# Patient Record
Sex: Male | Born: 1975 | Hispanic: Refuse to answer | Marital: Married
Health system: Southern US, Community
[De-identification: ages and names within clinical notes are randomized; demographics above are authoritative.]

---

## 2009-11-02 ENCOUNTER — Encounter (INDEPENDENT_AMBULATORY_CARE_PROVIDER_SITE_OTHER): Payer: Self-pay | Admitting: *Deleted

## 2009-11-09 ENCOUNTER — Ambulatory Visit: Payer: Self-pay | Admitting: Sports Medicine

## 2009-11-09 DIAGNOSIS — M25569 Pain in unspecified knee: Secondary | ICD-10-CM | POA: Insufficient documentation

## 2009-12-09 ENCOUNTER — Ambulatory Visit: Payer: Self-pay | Admitting: Sports Medicine

## 2009-12-13 ENCOUNTER — Encounter: Payer: Self-pay | Admitting: Sports Medicine

## 2013-11-25 ENCOUNTER — Ambulatory Visit
Admission: RE | Admit: 2013-11-25 | Discharge: 2013-11-25 | Disposition: A | Discharge status: Home / Self Care | Payer: Worker's Compensation | Source: Ambulatory Visit | Attending: Occupational Medicine | Admitting: Occupational Medicine

## 2013-11-25 ENCOUNTER — Other Ambulatory Visit: Payer: Self-pay | Admitting: Occupational Medicine

## 2013-11-25 DIAGNOSIS — T1490XA Injury, unspecified, initial encounter: Secondary | ICD-10-CM

## 2020-10-02 ENCOUNTER — Emergency Department (HOSPITAL_COMMUNITY)
Admission: EM | Admit: 2020-10-02 | Discharge: 2020-10-02 | Disposition: A | Discharge status: Home / Self Care | Payer: 59 | Attending: Emergency Medicine | Admitting: Emergency Medicine

## 2020-10-02 ENCOUNTER — Other Ambulatory Visit: Payer: Self-pay

## 2020-10-02 ENCOUNTER — Emergency Department (HOSPITAL_COMMUNITY): Payer: 59

## 2020-10-02 DIAGNOSIS — R110 Nausea: Secondary | ICD-10-CM | POA: Diagnosis not present

## 2020-10-02 DIAGNOSIS — R42 Dizziness and giddiness: Secondary | ICD-10-CM | POA: Insufficient documentation

## 2020-10-02 DIAGNOSIS — R079 Chest pain, unspecified: Secondary | ICD-10-CM | POA: Diagnosis present

## 2020-10-02 DIAGNOSIS — R0789 Other chest pain: Secondary | ICD-10-CM | POA: Insufficient documentation

## 2020-10-02 LAB — BASIC METABOLIC PANEL
Anion gap: 10 (ref 5–15)
BUN: 14 mg/dL (ref 6–20)
CO2: 24 mmol/L (ref 22–32)
Calcium: 9 mg/dL (ref 8.9–10.3)
Chloride: 105 mmol/L (ref 98–111)
Creatinine, Ser: 0.98 mg/dL (ref 0.61–1.24)
GFR, Estimated: >60 mL/min (ref >60)
Glucose, Bld: 100 mg/dL — ABNORMAL HIGH (ref 70–99)
Potassium: 4 mmol/L (ref 3.5–5.1)
Sodium: 139 mmol/L (ref 135–145)

## 2020-10-02 LAB — CBC
HCT: 40.5 % (ref 39.0–52.0)
Hemoglobin: 13.3 g/dL (ref 13.0–17.0)
MCH: 28.8 pg (ref 26.0–34.0)
MCHC: 32.8 g/dL (ref 30.0–36.0)
MCV: 87.7 fL (ref 80.0–100.0)
Platelets: 261 10*3/uL (ref 150–400)
RBC: 4.62 MIL/uL (ref 4.22–5.81)
RDW: 12.3 % (ref 11.5–15.5)
WBC: 5.4 10*3/uL (ref 4.0–10.5)
nRBC: 0 % (ref 0.0–0.2)

## 2020-10-02 LAB — TROPONIN I (HIGH SENSITIVITY)
Troponin I (High Sensitivity): <2 ng/L (ref <18)
Troponin I (High Sensitivity): <2 ng/L (ref <18)

## 2020-10-02 MED ORDER — METHOCARBAMOL 500 MG PO TABS
500.0000 mg | ORAL_TABLET | Freq: Once | ORAL | Status: DC
  Filled 2020-10-02: qty 1

## 2020-10-02 MED ORDER — ALUM & MAG HYDROXIDE-SIMETH 200-200-20 MG/5ML PO SUSP
30.0000 mL | Freq: Once | ORAL | Status: AC
  Administered 2020-10-02: 30 mL via ORAL
  Filled 2020-10-02: qty 30

## 2020-10-02 NOTE — ED Provider Notes (Signed)
[facility] EMERGENCY DEPARTMENT Provider Note   CSN: [id number] Arrival date & time: 10/02/20  1152     History Chief Complaint  Patient presents with  . Chest Pain    Scott Douglas is a 44 y.o. male who presents to ED with a chief complaint of chest pain.  Approximately 10 hours ago developed left-sided chest pain radiating to back and left arm.  Describes the pain as sharp, intermittent without specific aggravating or alleviating factor.  Reports similar chest pain in the past but states that the back pain and arm pain is unlike anything he has felt before.  He denies any injury or trauma.  He takes 800 mg of ibuprofen every morning due to arthritis, he took it this morning without significant improvement.  Does endorse nausea, lightheadedness this morning while ambulating in the grocery store.  He denies history of MI, DVT, PE, recent immobilization, leg swelling, hemoptysis, family history of sudden cardiac death at a young age.  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  He denies any abdominal pain, vomiting, fever, cough, shortness of breath.  HPI     No past medical history on file.  Patient Active Problem List   Diagnosis Date Noted  . KNEE PAIN 11/09/2009       No family history on file.  Social History   Tobacco Use  . Smoking status: Not on file  Substance Use Topics  . Alcohol use: Not on file  . Drug use: Not on file    Home Medications Prior to Admission medications   Not on File    Allergies    Patient has no known allergies.  Review of Systems   Review of Systems  Constitutional: Negative for appetite change, chills and fever.  HENT: Negative for ear pain, rhinorrhea, sneezing and sore throat.   Eyes: Negative for photophobia and visual disturbance.  Respiratory: Negative for cough, chest tightness, shortness of breath and wheezing.    Cardiovascular: Positive for chest pain. Negative for palpitations.  Gastrointestinal: Negative for abdominal pain, blood in stool, constipation, diarrhea, nausea and vomiting.  Genitourinary: Negative for dysuria, hematuria and urgency.  Musculoskeletal: Positive for back pain. Negative for myalgias.  Skin: Negative for rash.  Neurological: Negative for dizziness, weakness and light-headedness.    Physical Exam Updated Vital Signs BP (!) 129/93   Pulse 74   Temp 98.1 F (36.7 C) (Oral)   Resp 16   Ht 5\' 10"  (1.778 m)   Wt 90.7 kg   SpO2 100%   BMI 28.70 kg/m   Physical Exam Vitals and nursing note reviewed.  Constitutional:      General: He is not in acute distress.    Appearance: He is well-developed.     Comments: Speaking in complete sentences without difficulty. No signs of respiratory distress.  HENT:     Head: Normocephalic and atraumatic.     Nose: Nose normal.  Eyes:     General: No scleral icterus.       Left eye: No discharge.     Conjunctiva/sclera: Conjunctivae normal.  Cardiovascular:     Rate and Rhythm: Normal rate and regular rhythm.     Heart sounds: Normal heart sounds. No murmur heard.  No friction rub. No gallop.   Pulmonary:     Effort: Pulmonary effort is normal. No respiratory distress.     Breath sounds: Normal breath sounds.  Abdominal:     General: Bowel sounds are normal. There is no distension.     Palpations: Abdomen is soft.     Tenderness: There is no abdominal tenderness. There is no guarding.  Musculoskeletal:        General: Normal range of motion.     Cervical back: Normal range of motion and neck supple.     Comments: No lower extremity edema, erythema or calf tenderness bilaterally.  Skin:    General: Skin is warm and dry.     Findings: No rash.  Neurological:     Mental Status: He is alert and oriented to person, place, and time.     Cranial Nerves: No cranial nerve deficit.     Motor: No abnormal muscle tone.      Coordination: Coordination normal.     ED Results / Procedures / Treatments   Labs (all labs ordered are listed, but only abnormal results are displayed) Labs Reviewed  BASIC METABOLIC PANEL - Abnormal; Notable for the following components:      Result Value   Glucose, Bld 100 (*)    All other components within normal limits  CBC  TROPONIN I (HIGH SENSITIVITY)  TROPONIN I (HIGH SENSITIVITY)    EKG EKG Interpretation  Date/Time:  Saturday October 02 2020 11:51:52 EDT Ventricular Rate:  73 PR Interval:  162 QRS Duration: 104 QT Interval:  382 QTC Calculation: 420 R Axis:   57 Text Interpretation: Normal sinus rhythm Incomplete right bundle branch block Borderline ECG No old tracing to compare Confirmed by Rolan Bucco [id number]) on 10/02/2020 2:10:19 PM   Radiology DG Chest 2 View  Result Date: 10/02/2020 CLINICAL DATA:  Chest pain EXAM: CHEST - 2 VIEW COMPARISON:  None. FINDINGS: The heart size and mediastinal contours are within normal limits. Both lungs are clear. The visualized skeletal structures are unremarkable. IMPRESSION: No active cardiopulmonary disease. Electronically Signed   By: Sherian Rein M.D.   On: 10/02/2020 13:11    Procedures Procedures (including critical care time)  Medications Ordered in ED Medications  methocarbamol (ROBAXIN) tablet 500 mg (500 mg Oral Not Given 10/02/20 1410)  alum & mag hydroxide-simeth (MAALOX/MYLANTA) 200-200-20 MG/5ML suspension 30 mL (30 mLs Oral Given 10/02/20 1405)    ED Course  I have reviewed the triage vital signs and the nursing notes.  Pertinent labs & imaging results that were available during my care of the patient were reviewed by me and considered in my medical decision making (see chart for details).    MDM Rules/Calculators/A&P                          45 year old male presenting to the ED with a chief complaint of chest pain.  Intermittent left-sided chest pain for the past 10 hours radiating to back and  left arm.  Intermittent without specific aggravating or alleviating factors.  Similar chest pain in the past although it is not radiated like this.  Reports nausea and lightheadedness but denies any abdominal pain, vomiting, fever, cough or leg swelling.  No history of DVT, PE, MI, recent immobilization, hemoptysis.  He took a dose of ibuprofen this morning as he does every morning to help with arthritis.  No significant improvement in pain.  On exam patient is overall well-appearing.  No lower extremity edema, erythema or calf tenderness that concern me for DVT.  He is hemodynamically stable, he is not tachycardic, tachypneic or hypoxic.  Abdomen is soft,  nontender nondistended.  EKG shows sinus rhythm, IRBBB, no STEMI, no prior tracings for comparison.  Chest x-ray without any acute abnormalities.  CBC, BMP unremarkable.  Initial and delta troponin are both unremarkable.  Patient is low risk by heart score (2) and with his reassuring work-up today doubt his symptoms are due to ACS.  He is PERC negative.  No structural cause such as pneumothorax or pneumonia. Low suspicion for dissection. Suspect musculoskeletal cause versus gastritis versus other nonemergent.  Feel that he will benefit from PCP follow-up.  He remains in no acute distress here. Patient is agreeable to the plan. Return precautions given.  All imaging, if done today, including plain films, CT scans, and ultrasounds, independently reviewed by me, and interpretations confirmed via formal radiology reads.  Patient is hemodynamically stable, in NAD, and able to ambulate in the ED. Evaluation does not show pathology that would require ongoing emergent intervention or inpatient treatment. I explained the diagnosis to the patient. Pain has been managed and has no complaints prior to discharge. Patient is comfortable with above plan and is stable for discharge at this time. All questions were answered prior to disposition. Strict return precautions for  returning to the ED were discussed. Encouraged follow up with PCP.   An After Visit Summary was printed and given to the patient.   Portions of this note were generated with Scientist, clinical (histocompatibility and immunogenetics). Dictation errors may occur despite best attempts at proofreading.  Final Clinical Impression(s) / ED Diagnoses Final diagnoses:  Chest wall pain    Rx / DC Orders ED Discharge Orders    None       Dietrich Pates, PA-C 10/02/20 1536    Rolan Bucco, MD 10/04/20 [phone]

## 2020-10-02 NOTE — ED Triage Notes (Signed)
Pt here from work with [employer] for eval of intermittent L sided chest pain with radiation to back and L arm since 0300 this morning. Endorses nausea and lightheadedness while walking in the grocery store this morning.

## 2020-10-02 NOTE — Discharge Instructions (Signed)
Your work-up today including your EKG, troponins (heart enzymes), chest x-ray, blood counts and electrolyte panel were all reassuring, no significant abnormalities were noted. It is important for you to follow-up with your primary care provider and/or cardiology as they deem necessary. Take over-the-counter medications to help with reflux or muscle pain as needed. Return to the ER if you start to experience worsening chest pain, shortness of breath, leg swelling, trouble walking or wheezing.

## 2022-02-18 IMAGING — DX DG CHEST 2V
2 series · 2 of 2 positions shown · non-contrast
Comparison: None.

CLINICAL DATA: Chest pain

EXAM:
CHEST - 2 VIEW

[w chest pa]
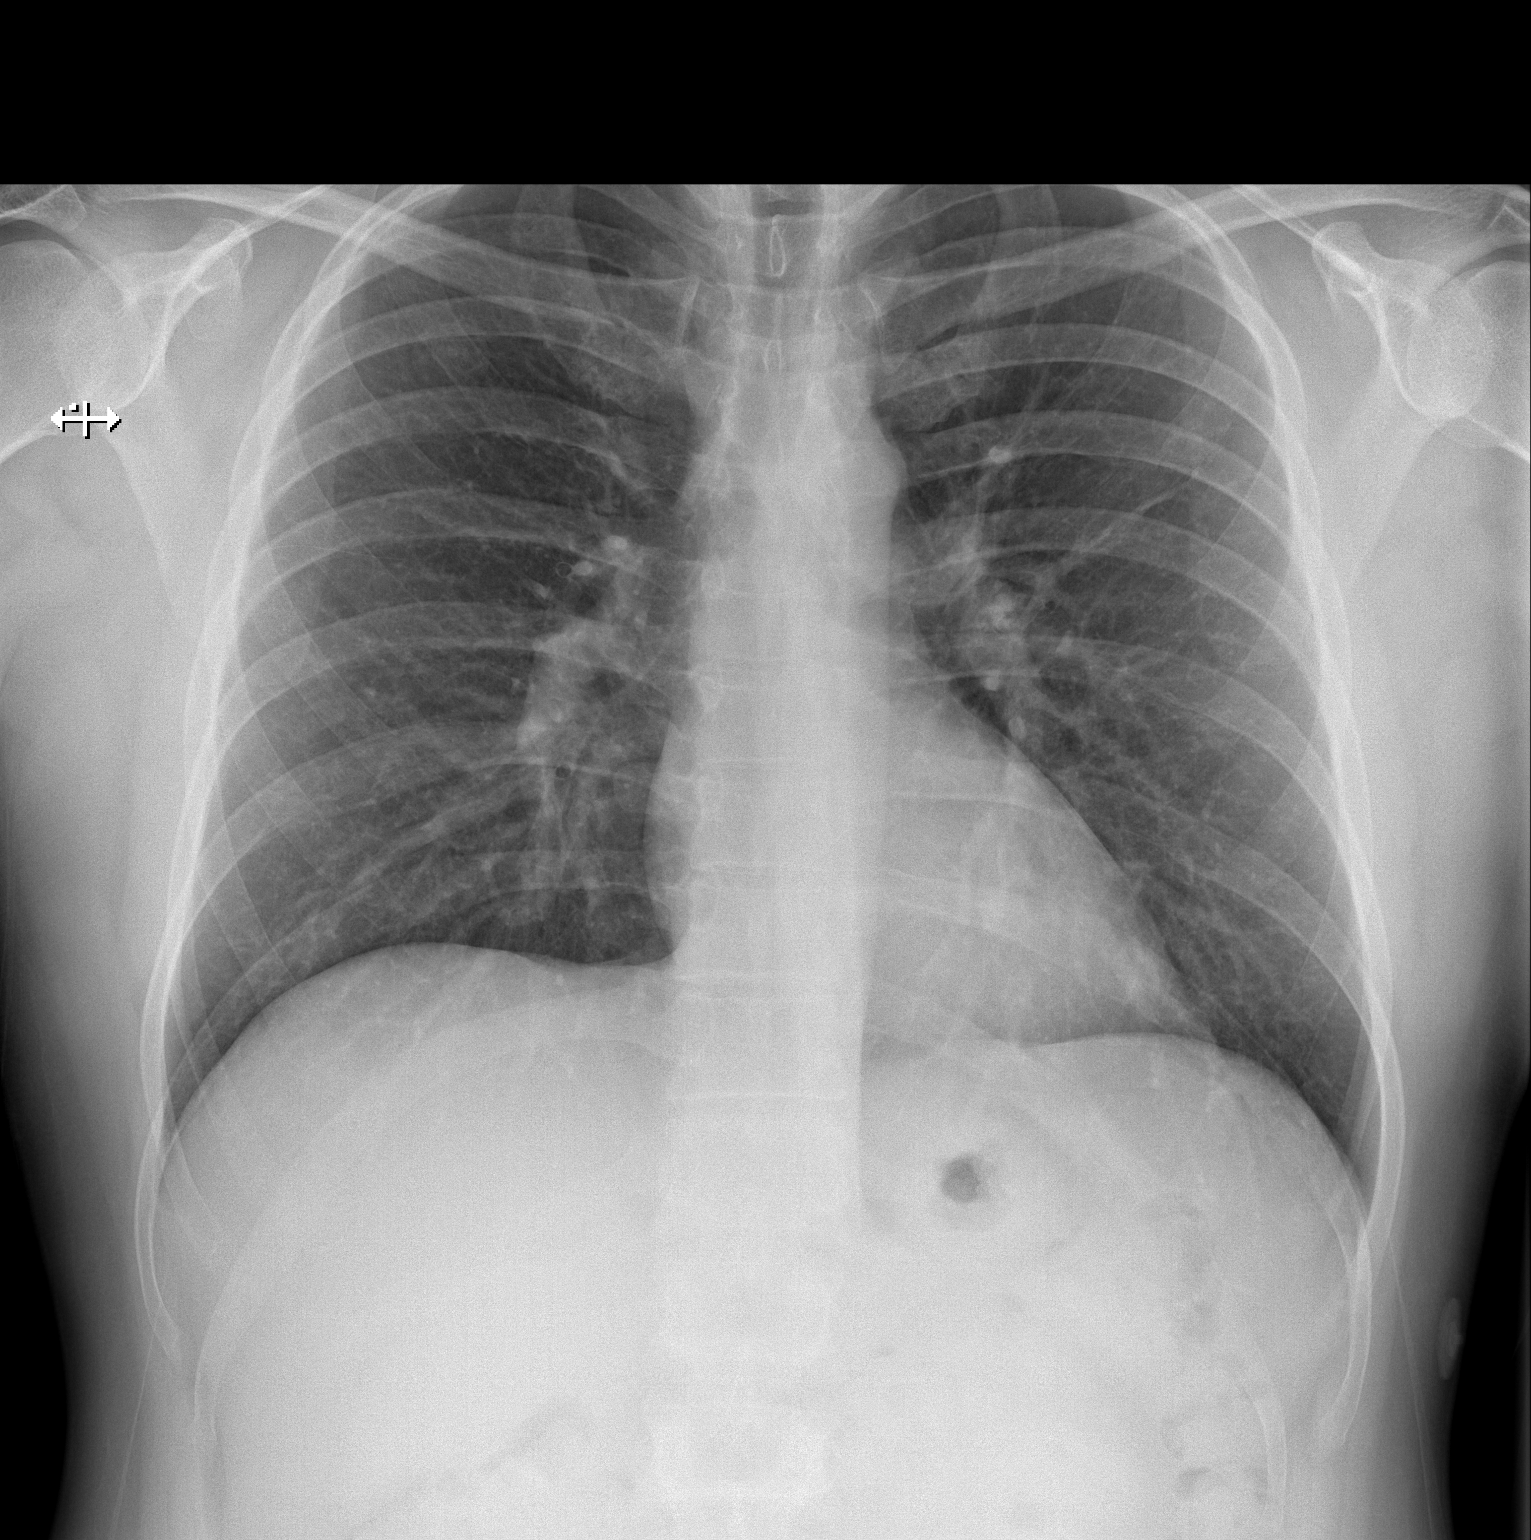

[w chest lat]
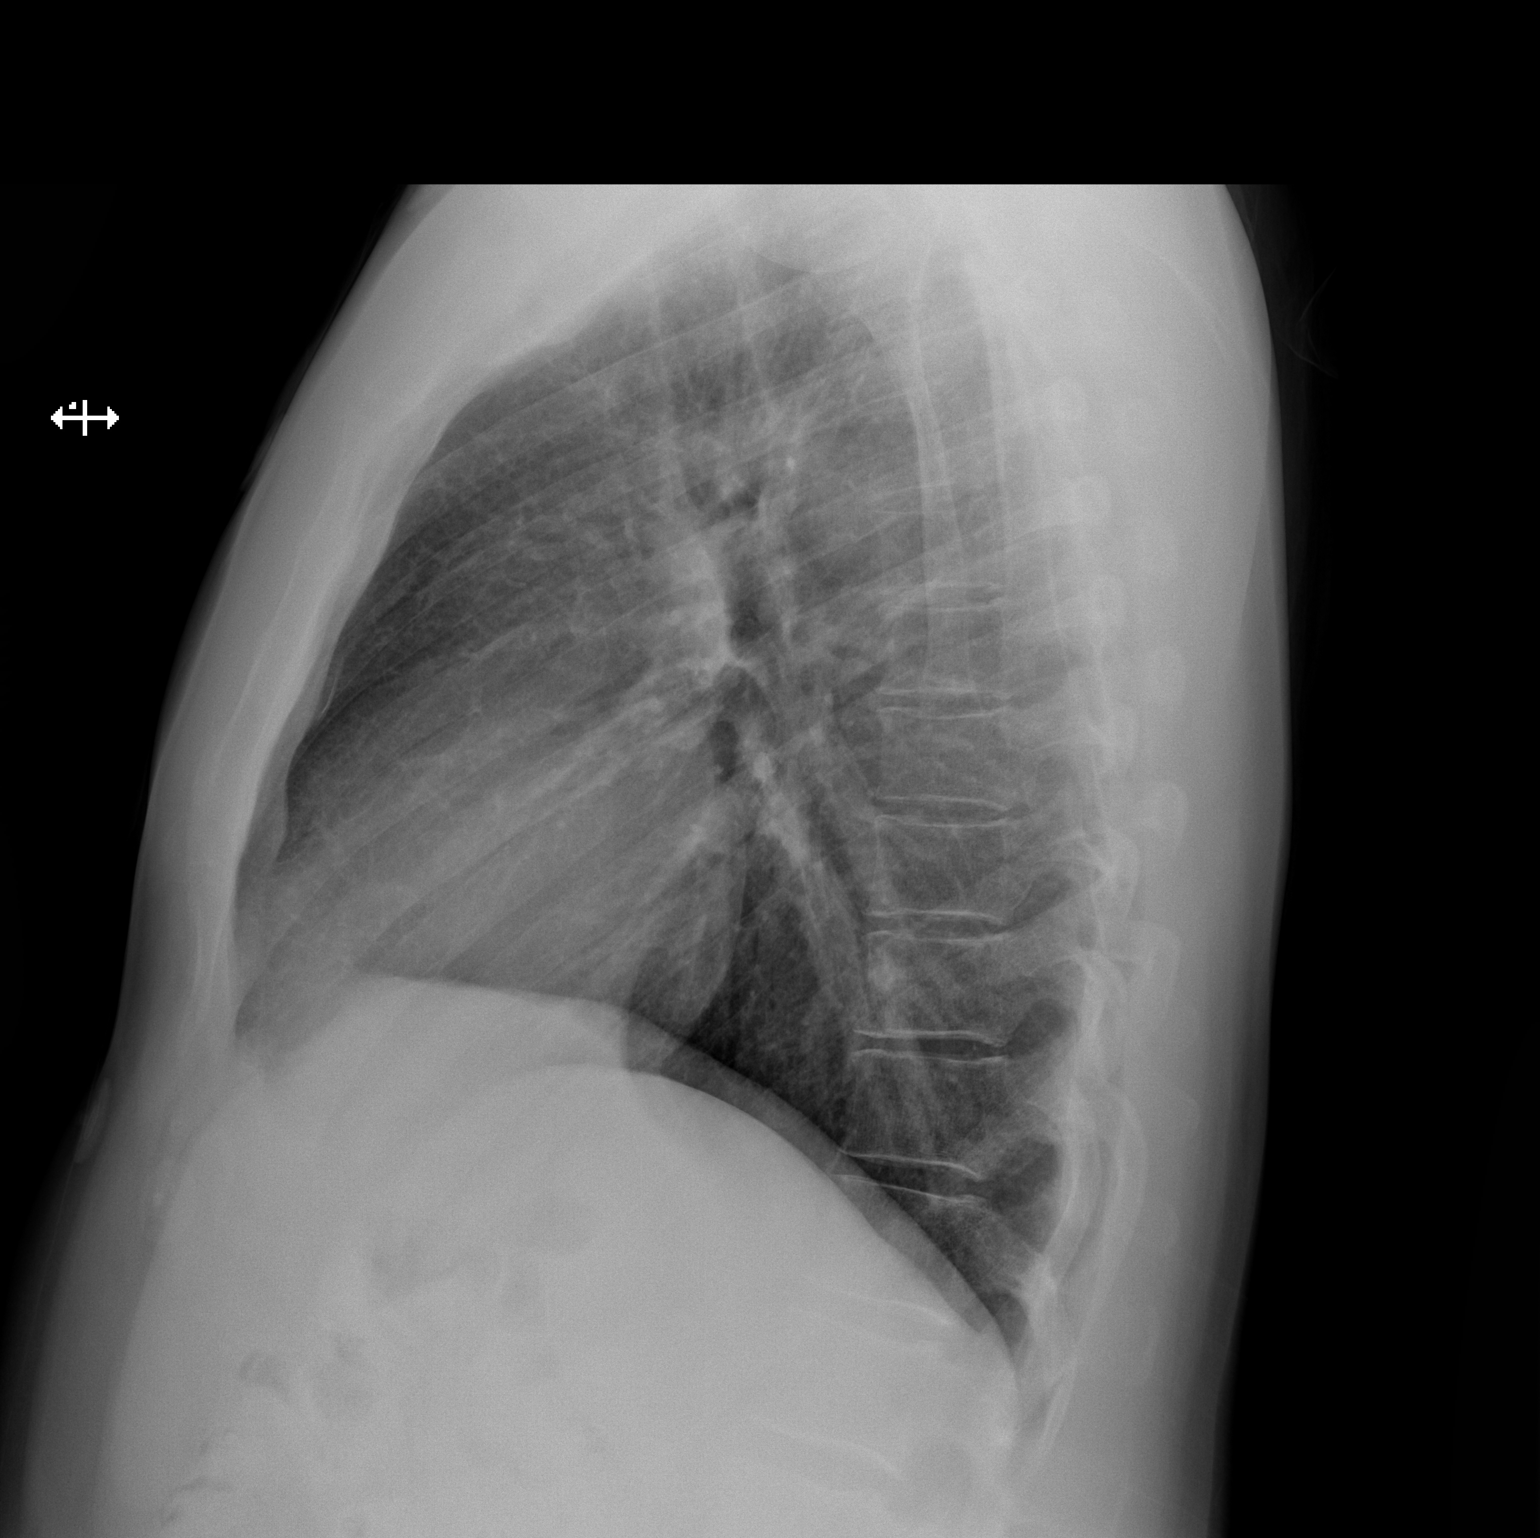

[2 of 2 positions shown; findings below may reference images not displayed]

FINDINGS: The heart size and mediastinal contours are within normal limits.
Both lungs are clear. The visualized skeletal structures are
unremarkable.
IMPRESSION: No active cardiopulmonary disease.

## 2024-10-16 ENCOUNTER — Other Ambulatory Visit (HOSPITAL_BASED_OUTPATIENT_CLINIC_OR_DEPARTMENT_OTHER): Payer: Self-pay | Admitting: Family Medicine

## 2024-10-16 DIAGNOSIS — Z8249 Family history of ischemic heart disease and other diseases of the circulatory system: Secondary | ICD-10-CM

## 2024-12-16 ENCOUNTER — Ambulatory Visit (HOSPITAL_BASED_OUTPATIENT_CLINIC_OR_DEPARTMENT_OTHER)
Admission: RE | Admit: 2024-12-16 | Discharge: 2024-12-16 | Disposition: A | Discharge status: Home / Self Care | Payer: Self-pay | Source: Ambulatory Visit | Attending: Family Medicine | Admitting: Family Medicine

## 2024-12-16 DIAGNOSIS — Z8249 Family history of ischemic heart disease and other diseases of the circulatory system: Secondary | ICD-10-CM
